# Patient Record
Sex: Male | Born: 1960 | Race: White | Hispanic: No | Marital: Married | Smoking: Never smoker
Health system: Southern US, Community
[De-identification: ages and names within clinical notes are randomized; demographics above are authoritative.]

## PROBLEM LIST (undated history)

## (undated) DIAGNOSIS — E0590 Thyrotoxicosis, unspecified without thyrotoxic crisis or storm: Secondary | ICD-10-CM

## (undated) DIAGNOSIS — C20 Malignant neoplasm of rectum: Secondary | ICD-10-CM

## (undated) DIAGNOSIS — C801 Malignant (primary) neoplasm, unspecified: Secondary | ICD-10-CM

## (undated) DIAGNOSIS — M1990 Unspecified osteoarthritis, unspecified site: Secondary | ICD-10-CM

## (undated) DIAGNOSIS — I10 Essential (primary) hypertension: Secondary | ICD-10-CM

## (undated) DIAGNOSIS — G4730 Sleep apnea, unspecified: Secondary | ICD-10-CM

## (undated) HISTORY — PX: REFRACTIVE SURGERY: SHX103

---

## 2014-10-30 ENCOUNTER — Other Ambulatory Visit: Payer: Self-pay | Admitting: General Surgery

## 2014-10-30 DIAGNOSIS — C799 Secondary malignant neoplasm of unspecified site: Principal | ICD-10-CM

## 2014-10-30 DIAGNOSIS — C20 Malignant neoplasm of rectum: Secondary | ICD-10-CM

## 2014-10-31 ENCOUNTER — Other Ambulatory Visit: Payer: Self-pay | Admitting: General Surgery

## 2014-10-31 DIAGNOSIS — C20 Malignant neoplasm of rectum: Secondary | ICD-10-CM

## 2014-10-31 DIAGNOSIS — C799 Secondary malignant neoplasm of unspecified site: Secondary | ICD-10-CM

## 2014-11-06 ENCOUNTER — Encounter (HOSPITAL_COMMUNITY): Payer: Self-pay

## 2014-11-06 ENCOUNTER — Ambulatory Visit (HOSPITAL_COMMUNITY)
Admission: RE | Admit: 2014-11-06 | Discharge: 2014-11-06 | Disposition: A | Discharge status: Home / Self Care | Payer: BLUE CROSS/BLUE SHIELD | Source: Ambulatory Visit | Attending: General Surgery | Admitting: General Surgery

## 2014-11-06 DIAGNOSIS — C20 Malignant neoplasm of rectum: Secondary | ICD-10-CM | POA: Diagnosis not present

## 2014-11-06 HISTORY — DX: Essential (primary) hypertension: I10

## 2014-11-06 HISTORY — DX: Malignant (primary) neoplasm, unspecified: C80.1

## 2014-11-06 MED ORDER — IOHEXOL 300 MG/ML  SOLN
75.0000 mL | Freq: Once | INTRAMUSCULAR | Status: AC | PRN
Start: 2014-11-06 — End: 2014-11-06
  Administered 2014-11-06: 75 mL via INTRAVENOUS

## 2014-11-11 ENCOUNTER — Other Ambulatory Visit: Payer: Self-pay | Admitting: General Surgery

## 2014-11-19 ENCOUNTER — Other Ambulatory Visit (HOSPITAL_COMMUNITY): Payer: BLUE CROSS/BLUE SHIELD

## 2014-11-19 NOTE — Patient Instructions (Addendum)
YOUR PROCEDURE IS SCHEDULED ON :  11/22/14  REPORT TO [facility] MAIN ENTRANCE FOLLOW SIGNS TO EAST ELEVATOR - GO TO 3rd FLOOR CHECK IN AT 3 EAST NURSES STATION (SHORT STAY) AT:  5:30 AM  CALL THIS NUMBER IF YOU HAVE PROBLEMS THE MORNING OF SURGERY [phone]  REMEMBER:ONLY 1 PER PERSON MAY GO TO SHORT STAY WITH YOU TO GET READY THE MORNING OF YOUR SURGERY  DO NOT EAT FOOD OR DRINK LIQUIDS AFTER MIDNIGHT  TAKE THESE MEDICINES THE MORNING OF SURGERY:  LEVOTHYROXINE / FLUVASTATIN  YOU MAY NOT HAVE ANY METAL ON YOUR BODY INCLUDING HAIR PINS AND PIERCING'S. DO NOT WEAR JEWELRY, MAKEUP, LOTIONS, POWDERS OR PERFUMES. DO NOT WEAR NAIL POLISH. DO NOT SHAVE 48 HRS PRIOR TO SURGERY. MEN MAY SHAVE FACE AND NECK.  DO NOT [place]. Roberto Mitchell IS NOT RESPONSIBLE FOR VALUABLES.  CONTACTS, DENTURES OR PARTIALS MAY NOT BE WORN TO SURGERY. LEAVE SUITCASE IN CAR. CAN BE BROUGHT TO ROOM AFTER SURGERY.  PATIENTS DISCHARGED THE DAY OF SURGERY WILL NOT BE ALLOWED TO DRIVE HOME.  PLEASE READ OVER THE FOLLOWING INSTRUCTION SHEETS _________________________________________________________________________________                                          [place] - PREPARING FOR SURGERY  Before surgery, you can play an important role.  Because skin is not sterile, your skin needs to be as free of germs as possible.  You can reduce the number of germs on your skin by washing with CHG (chlorahexidine gluconate) soap before surgery.  CHG is an antiseptic cleaner which kills germs and bonds with the skin to continue killing germs even after washing. Please DO NOT use if you have an allergy to CHG or antibacterial soaps.  If your skin becomes reddened/irritated stop using the CHG and inform your nurse when you arrive at Short Stay. Do not shave (including legs and underarms) for at least 48 hours prior to the first CHG shower.  You may shave your face. Please follow these  instructions carefully:   1.  Shower with CHG Soap the night before surgery and the  morning of Surgery.   2.  If you choose to wash your hair, wash your hair first as usual with your  normal  Shampoo.   3.  After you shampoo, rinse your hair and body thoroughly to remove the  shampoo.                                         4.  Use CHG as you would any other liquid soap.  You can apply chg directly  to the skin and wash . Gently wash with scrungie or clean wascloth    5.  Apply the CHG Soap to your body ONLY FROM THE NECK DOWN.   Do not use on open                           Wound or open sores. Avoid contact with eyes, ears mouth and genitals (private parts).                        Genitals (private parts) with your normal  soap.              6.  Wash thoroughly, paying special attention to the area where your surgery  will be performed.   7.  Thoroughly rinse your body with warm water from the neck down.   8.  DO NOT shower/wash with your normal soap after using and rinsing off  the CHG Soap .                9.  Pat yourself dry with a clean towel.             10.  Wear clean night clothes to bed after shower             11.  Place clean sheets on your bed the night of your first shower and do not  sleep with pets.  Day of Surgery : Do not apply any lotions/deodorants the morning of surgery.  Please wear clean clothes to the hospital/surgery center.  FAILURE TO FOLLOW THESE INSTRUCTIONS MAY RESULT IN THE CANCELLATION OF YOUR SURGERY    PATIENT SIGNATURE_________________________________  ______________________________________________________________________                   CLEAR LIQUID DIET   Foods Allowed                                                                     Foods Excluded  Coffee and tea, regular and decaf                             liquids that you cannot  Plain Jell-O in any flavor                                             see  through such as: Fruit ices (not with fruit pulp)                                     milk, soups, orange juice  Iced Popsicles                                                 All solid food Carbonated beverages, regular and diet                                    Cranberry, grape and apple juices Sports drinks like Gatorade Lightly seasoned clear broth or consume(fat free) Sugar, honey syrup  Sample Menu Breakfast                                Lunch  Supper Cranberry juice                    Beef broth                            Chicken broth Jell-O                                     Grape juice                           Apple juice Coffee or tea                        Jell-O                                      Popsicle                                                Coffee or tea                        Coffee or tea  _____________________________________________________________________

## 2014-11-20 ENCOUNTER — Encounter (HOSPITAL_COMMUNITY): Payer: Self-pay

## 2014-11-20 ENCOUNTER — Encounter (HOSPITAL_COMMUNITY)
Admission: RE | Admit: 2014-11-20 | Discharge: 2014-11-20 | Disposition: A | Discharge status: Home / Self Care | Payer: BLUE CROSS/BLUE SHIELD | Source: Ambulatory Visit | Attending: General Surgery | Admitting: General Surgery

## 2014-11-20 DIAGNOSIS — Z9841 Cataract extraction status, right eye: Secondary | ICD-10-CM | POA: Diagnosis not present

## 2014-11-20 DIAGNOSIS — M1990 Unspecified osteoarthritis, unspecified site: Secondary | ICD-10-CM | POA: Diagnosis not present

## 2014-11-20 DIAGNOSIS — Z79899 Other long term (current) drug therapy: Secondary | ICD-10-CM | POA: Diagnosis not present

## 2014-11-20 DIAGNOSIS — Z8371 Family history of colonic polyps: Secondary | ICD-10-CM | POA: Diagnosis not present

## 2014-11-20 DIAGNOSIS — Z7982 Long term (current) use of aspirin: Secondary | ICD-10-CM | POA: Diagnosis not present

## 2014-11-20 DIAGNOSIS — Z9842 Cataract extraction status, left eye: Secondary | ICD-10-CM | POA: Diagnosis not present

## 2014-11-20 DIAGNOSIS — E039 Hypothyroidism, unspecified: Secondary | ICD-10-CM | POA: Diagnosis not present

## 2014-11-20 DIAGNOSIS — Z791 Long term (current) use of non-steroidal anti-inflammatories (NSAID): Secondary | ICD-10-CM | POA: Diagnosis not present

## 2014-11-20 DIAGNOSIS — I10 Essential (primary) hypertension: Secondary | ICD-10-CM | POA: Diagnosis not present

## 2014-11-20 DIAGNOSIS — C20 Malignant neoplasm of rectum: Secondary | ICD-10-CM | POA: Diagnosis not present

## 2014-11-20 DIAGNOSIS — E7800 Pure hypercholesterolemia, unspecified: Secondary | ICD-10-CM | POA: Diagnosis not present

## 2014-11-20 DIAGNOSIS — K649 Unspecified hemorrhoids: Secondary | ICD-10-CM | POA: Diagnosis not present

## 2014-11-20 HISTORY — DX: Unspecified osteoarthritis, unspecified site: M19.90

## 2014-11-20 HISTORY — DX: Malignant neoplasm of rectum: C20

## 2014-11-20 HISTORY — DX: Thyrotoxicosis, unspecified without thyrotoxic crisis or storm: E05.90

## 2014-11-20 LAB — BASIC METABOLIC PANEL
Anion gap: 7 (ref 5–15)
BUN: 17 mg/dL (ref 6–20)
CHLORIDE: 104 mmol/L (ref 101–111)
CO2: 27 mmol/L (ref 22–32)
CREATININE: 1.32 mg/dL — AB (ref 0.61–1.24)
Calcium: 9.2 mg/dL (ref 8.9–10.3)
GFR calc non Af Amer: >60 mL/min (ref >60)
Glucose, Bld: 87 mg/dL (ref 65–99)
POTASSIUM: 4.2 mmol/L (ref 3.5–5.1)
SODIUM: 138 mmol/L (ref 135–145)

## 2014-11-20 LAB — CBC
HEMATOCRIT: 43.5 % (ref 39.0–52.0)
HEMOGLOBIN: 15 g/dL (ref 13.0–17.0)
MCH: 32.5 pg (ref 26.0–34.0)
MCHC: 34.5 g/dL (ref 30.0–36.0)
MCV: 94.2 fL (ref 78.0–100.0)
Platelets: 186 10*3/uL (ref 150–400)
RBC: 4.62 MIL/uL (ref 4.22–5.81)
RDW: 13 % (ref 11.5–15.5)
WBC: 8.9 10*3/uL (ref 4.0–10.5)

## 2014-11-20 NOTE — Progress Notes (Signed)
   11/20/14 1331  OBSTRUCTIVE SLEEP APNEA  Have you ever been diagnosed with sleep apnea through a sleep study? No  Do you snore loudly (loud enough to be heard through closed doors)?  1  Do you often feel tired, fatigued, or sleepy during the daytime (such as falling asleep during driving or talking to someone)? 0  Has anyone observed you stop breathing during your sleep? 0  Do you have, or are you being treated for high blood pressure? 1  BMI more than 35 kg/m2? 1  Age > 50 (1-yes) 1  Neck circumference greater than:Male 16 inches or larger, Male 17inches or larger? 1  Male Gender (Yes=1) 1  Obstructive Sleep Apnea Score 6

## 2014-11-21 MED ORDER — SODIUM CHLORIDE 0.9 % IV SOLN
1.0000 g | INTRAVENOUS | Status: AC
  Administered 2014-11-22: 1 g via INTRAVENOUS
  Filled 2014-11-21 (×2): qty 1

## 2014-11-22 ENCOUNTER — Observation Stay (HOSPITAL_COMMUNITY)
Admission: RE | Admit: 2014-11-22 | Discharge: 2014-11-23 | Disposition: A | Discharge status: Home / Self Care | Payer: BLUE CROSS/BLUE SHIELD | Source: Ambulatory Visit | Attending: General Surgery | Admitting: General Surgery

## 2014-11-22 ENCOUNTER — Encounter (HOSPITAL_COMMUNITY): Payer: Self-pay | Admitting: *Deleted

## 2014-11-22 ENCOUNTER — Ambulatory Visit (HOSPITAL_COMMUNITY): Payer: BLUE CROSS/BLUE SHIELD

## 2014-11-22 ENCOUNTER — Encounter (HOSPITAL_COMMUNITY)
Admission: RE | Disposition: A | Discharge status: Home / Self Care | Payer: Self-pay | Source: Ambulatory Visit | Attending: General Surgery

## 2014-11-22 ENCOUNTER — Ambulatory Visit (HOSPITAL_COMMUNITY): Payer: BLUE CROSS/BLUE SHIELD | Admitting: Certified Registered"

## 2014-11-22 DIAGNOSIS — I10 Essential (primary) hypertension: Secondary | ICD-10-CM | POA: Insufficient documentation

## 2014-11-22 DIAGNOSIS — Z79899 Other long term (current) drug therapy: Secondary | ICD-10-CM | POA: Insufficient documentation

## 2014-11-22 DIAGNOSIS — E039 Hypothyroidism, unspecified: Secondary | ICD-10-CM | POA: Insufficient documentation

## 2014-11-22 DIAGNOSIS — Z9842 Cataract extraction status, left eye: Secondary | ICD-10-CM | POA: Insufficient documentation

## 2014-11-22 DIAGNOSIS — C20 Malignant neoplasm of rectum: Principal | ICD-10-CM | POA: Diagnosis present

## 2014-11-22 DIAGNOSIS — Z791 Long term (current) use of non-steroidal anti-inflammatories (NSAID): Secondary | ICD-10-CM | POA: Insufficient documentation

## 2014-11-22 DIAGNOSIS — E7800 Pure hypercholesterolemia, unspecified: Secondary | ICD-10-CM | POA: Insufficient documentation

## 2014-11-22 DIAGNOSIS — Z9841 Cataract extraction status, right eye: Secondary | ICD-10-CM | POA: Insufficient documentation

## 2014-11-22 DIAGNOSIS — Z7982 Long term (current) use of aspirin: Secondary | ICD-10-CM | POA: Insufficient documentation

## 2014-11-22 DIAGNOSIS — Z8371 Family history of colonic polyps: Secondary | ICD-10-CM | POA: Insufficient documentation

## 2014-11-22 DIAGNOSIS — K649 Unspecified hemorrhoids: Secondary | ICD-10-CM | POA: Insufficient documentation

## 2014-11-22 DIAGNOSIS — M1990 Unspecified osteoarthritis, unspecified site: Secondary | ICD-10-CM | POA: Insufficient documentation

## 2014-11-22 HISTORY — PX: TRANSANAL EXCISION OF RECTAL MASS: SHX6134

## 2014-11-22 SURGERY — EXCISION, MASS, RECTUM, ANAL APPROACH
Anesthesia: General | Site: Rectum

## 2014-11-22 MED ORDER — HYDROCODONE-ACETAMINOPHEN 5-325 MG PO TABS
1.0000 | ORAL_TABLET | ORAL | Status: DC | PRN
  Administered 2014-11-22: 2 via ORAL
  Administered 2014-11-22: 1 via ORAL
  Administered 2014-11-23: 2 via ORAL
  Filled 2014-11-22: qty 2
  Filled 2014-11-22: qty 1
  Filled 2014-11-22: qty 2

## 2014-11-22 MED ORDER — BUPIVACAINE-EPINEPHRINE 0.25% -1:200000 IJ SOLN
INTRAMUSCULAR | Status: DC | PRN
  Administered 2014-11-22: 21 mL

## 2014-11-22 MED ORDER — HYDROMORPHONE HCL 1 MG/ML IJ SOLN
INTRAMUSCULAR | Status: AC
  Filled 2014-11-22: qty 1

## 2014-11-22 MED ORDER — ONDANSETRON HCL 4 MG/2ML IJ SOLN
INTRAMUSCULAR | Status: DC | PRN
  Administered 2014-11-22: 4 mg via INTRAVENOUS

## 2014-11-22 MED ORDER — PROPOFOL 10 MG/ML IV BOLUS
INTRAVENOUS | Status: DC | PRN
  Administered 2014-11-22: 200 mg via INTRAVENOUS

## 2014-11-22 MED ORDER — FENTANYL CITRATE (PF) 100 MCG/2ML IJ SOLN
INTRAMUSCULAR | Status: DC | PRN
  Administered 2014-11-22: 50 ug via INTRAVENOUS
  Administered 2014-11-22: 100 ug via INTRAVENOUS

## 2014-11-22 MED ORDER — SENNOSIDES-DOCUSATE SODIUM 8.6-50 MG PO TABS
1.0000 | ORAL_TABLET | Freq: Every day | ORAL | Status: DC
  Administered 2014-11-22: 1 via ORAL
  Filled 2014-11-22: qty 1

## 2014-11-22 MED ORDER — ROCURONIUM BROMIDE 100 MG/10ML IV SOLN
INTRAVENOUS | Status: DC | PRN
  Administered 2014-11-22: 10 mg via INTRAVENOUS
  Administered 2014-11-22: 35 mg via INTRAVENOUS
  Administered 2014-11-22: 20 mg via INTRAVENOUS
  Administered 2014-11-22: 10 mg via INTRAVENOUS
  Administered 2014-11-22: 5 mg via INTRAVENOUS
  Administered 2014-11-22: 10 mg via INTRAVENOUS

## 2014-11-22 MED ORDER — BISACODYL 10 MG RE SUPP: 10.0000 mg | Freq: Every day | RECTAL | Status: DC | PRN

## 2014-11-22 MED ORDER — PROPOFOL 10 MG/ML IV BOLUS
INTRAVENOUS | Status: AC
  Filled 2014-11-22: qty 20

## 2014-11-22 MED ORDER — DEXAMETHASONE SODIUM PHOSPHATE 10 MG/ML IJ SOLN
INTRAMUSCULAR | Status: DC | PRN
  Administered 2014-11-22: 10 mg via INTRAVENOUS

## 2014-11-22 MED ORDER — NEOSTIGMINE METHYLSULFATE 10 MG/10ML IV SOLN
INTRAVENOUS | Status: DC | PRN
  Administered 2014-11-22: 5 mg via INTRAVENOUS

## 2014-11-22 MED ORDER — HYDROMORPHONE HCL 1 MG/ML IJ SOLN
INTRAMUSCULAR | Status: DC | PRN
  Administered 2014-11-22: 1 mg via INTRAVENOUS
  Administered 2014-11-22 (×2): 0.5 mg via INTRAVENOUS

## 2014-11-22 MED ORDER — MEPERIDINE HCL 50 MG/ML IJ SOLN: 6.2500 mg | INTRAMUSCULAR | Status: DC | PRN

## 2014-11-22 MED ORDER — BUPIVACAINE-EPINEPHRINE (PF) 0.25% -1:200000 IJ SOLN
INTRAMUSCULAR | Status: AC
  Filled 2014-11-22: qty 30

## 2014-11-22 MED ORDER — LEVOTHYROXINE SODIUM 25 MCG PO TABS
125.0000 ug | ORAL_TABLET | Freq: Every day | ORAL | Status: DC
  Administered 2014-11-23: 125 ug via ORAL
  Filled 2014-11-22 (×2): qty 1

## 2014-11-22 MED ORDER — GLYCOPYRROLATE 0.2 MG/ML IJ SOLN
INTRAMUSCULAR | Status: AC
  Filled 2014-11-22: qty 4

## 2014-11-22 MED ORDER — DEXAMETHASONE SODIUM PHOSPHATE 10 MG/ML IJ SOLN
INTRAMUSCULAR | Status: AC
  Filled 2014-11-22: qty 1

## 2014-11-22 MED ORDER — GLYCOPYRROLATE 0.2 MG/ML IJ SOLN
INTRAMUSCULAR | Status: DC | PRN
  Administered 2014-11-22: .8 mg via INTRAVENOUS

## 2014-11-22 MED ORDER — EPHEDRINE SULFATE 50 MG/ML IJ SOLN
INTRAMUSCULAR | Status: AC
  Filled 2014-11-22: qty 1

## 2014-11-22 MED ORDER — SIMETHICONE 80 MG PO CHEW: 40.0000 mg | CHEWABLE_TABLET | Freq: Four times a day (QID) | ORAL | Status: DC | PRN

## 2014-11-22 MED ORDER — MIDAZOLAM HCL 5 MG/5ML IJ SOLN
INTRAMUSCULAR | Status: DC | PRN
  Administered 2014-11-22: 2 mg via INTRAVENOUS

## 2014-11-22 MED ORDER — PROMETHAZINE HCL 25 MG/ML IJ SOLN
6.2500 mg | INTRAMUSCULAR | Status: DC | PRN
Start: 2014-11-22 — End: 2014-11-22

## 2014-11-22 MED ORDER — LIDOCAINE HCL (CARDIAC) 20 MG/ML IV SOLN
INTRAVENOUS | Status: AC
  Filled 2014-11-22: qty 5

## 2014-11-22 MED ORDER — ONDANSETRON 4 MG PO TBDP: 4.0000 mg | ORAL_TABLET | Freq: Four times a day (QID) | ORAL | Status: DC | PRN

## 2014-11-22 MED ORDER — EPHEDRINE SULFATE 50 MG/ML IJ SOLN
INTRAMUSCULAR | Status: DC | PRN
  Administered 2014-11-22 (×2): 5 mg via INTRAVENOUS

## 2014-11-22 MED ORDER — ONDANSETRON HCL 4 MG/2ML IJ SOLN: 4.0000 mg | Freq: Four times a day (QID) | INTRAMUSCULAR | Status: DC | PRN

## 2014-11-22 MED ORDER — CELECOXIB 200 MG PO CAPS
200.0000 mg | ORAL_CAPSULE | Freq: Every day | ORAL | Status: DC
  Administered 2014-11-22: 200 mg via ORAL
  Filled 2014-11-22 (×2): qty 1

## 2014-11-22 MED ORDER — 0.9 % SODIUM CHLORIDE (POUR BTL) OPTIME
TOPICAL | Status: DC | PRN
  Administered 2014-11-22: 1000 mL

## 2014-11-22 MED ORDER — LACTATED RINGERS IV SOLN: INTRAVENOUS | Status: DC

## 2014-11-22 MED ORDER — NEOSTIGMINE METHYLSULFATE 10 MG/10ML IV SOLN
INTRAVENOUS | Status: AC
  Filled 2014-11-22: qty 1

## 2014-11-22 MED ORDER — LACTATED RINGERS IV SOLN
INTRAVENOUS | Status: DC | PRN
  Administered 2014-11-22 (×2): via INTRAVENOUS

## 2014-11-22 MED ORDER — LACTATED RINGERS IR SOLN
Status: DC | PRN
  Administered 2014-11-22: 1000 mL

## 2014-11-22 MED ORDER — LIDOCAINE HCL (CARDIAC) 20 MG/ML IV SOLN
INTRAVENOUS | Status: DC | PRN
  Administered 2014-11-22: 50 mg via INTRAVENOUS

## 2014-11-22 MED ORDER — ONDANSETRON HCL 4 MG/2ML IJ SOLN
INTRAMUSCULAR | Status: AC
  Filled 2014-11-22: qty 2

## 2014-11-22 MED ORDER — KCL IN DEXTROSE-NACL 20-5-0.45 MEQ/L-%-% IV SOLN
INTRAVENOUS | Status: DC
  Filled 2014-11-22: qty 1000

## 2014-11-22 MED ORDER — HYDROMORPHONE HCL 2 MG/ML IJ SOLN
INTRAMUSCULAR | Status: AC
  Filled 2014-11-22: qty 1

## 2014-11-22 MED ORDER — HYDROCHLOROTHIAZIDE 12.5 MG PO CAPS
12.5000 mg | ORAL_CAPSULE | Freq: Every day | ORAL | Status: DC
  Administered 2014-11-22 – 2014-11-23 (×2): 12.5 mg via ORAL
  Filled 2014-11-22 (×2): qty 1

## 2014-11-22 MED ORDER — MORPHINE SULFATE (PF) 2 MG/ML IV SOLN
2.0000 mg | INTRAVENOUS | Status: DC | PRN
  Administered 2014-11-22: 2 mg via INTRAVENOUS
  Filled 2014-11-22: qty 1

## 2014-11-22 MED ORDER — ROCURONIUM BROMIDE 100 MG/10ML IV SOLN
INTRAVENOUS | Status: AC
  Filled 2014-11-22: qty 1

## 2014-11-22 MED ORDER — SUCCINYLCHOLINE CHLORIDE 20 MG/ML IJ SOLN
INTRAMUSCULAR | Status: DC | PRN
  Administered 2014-11-22: 100 mg via INTRAVENOUS

## 2014-11-22 MED ORDER — FENTANYL CITRATE (PF) 250 MCG/5ML IJ SOLN
INTRAMUSCULAR | Status: AC
  Filled 2014-11-22: qty 25

## 2014-11-22 MED ORDER — MIDAZOLAM HCL 2 MG/2ML IJ SOLN
INTRAMUSCULAR | Status: AC
  Filled 2014-11-22: qty 4

## 2014-11-22 MED ORDER — HYDROMORPHONE HCL 1 MG/ML IJ SOLN
0.2500 mg | INTRAMUSCULAR | Status: DC | PRN
  Administered 2014-11-22 (×4): 0.5 mg via INTRAVENOUS

## 2014-11-22 SURGICAL SUPPLY — 59 items
BLADE EXTENDED COATED 6.5IN (ELECTRODE) IMPLANT
BRIEF STRETCH FOR OB PAD LRG (UNDERPADS AND DIAPERS) IMPLANT
CABLE HIGH FREQUENCY MONO STRZ (ELECTRODE) ×2 IMPLANT
COVER SURGICAL LIGHT HANDLE (MISCELLANEOUS) IMPLANT
DEVICE SUT QUICK LOAD TK 5 (STAPLE) IMPLANT
DEVICE SUT TI-KNOT TK 5X26 (MISCELLANEOUS) IMPLANT
DRAPE CAMERA CLOSED 9X96 (DRAPES) ×2 IMPLANT
DRAPE LAPAROSCOPIC ABDOMINAL (DRAPES) IMPLANT
DRAPE LAPAROTOMY T 102X78X121 (DRAPES) IMPLANT
DRAPE WARM FLUID 44X44 (DRAPE) ×2 IMPLANT
ELECT L-HOOK LAP 45CM DISP (ELECTROSURGICAL) ×2
ELECT PENCIL ROCKER SW 15FT (MISCELLANEOUS) ×2 IMPLANT
ELECT REM PT RETURN 9FT ADLT (ELECTROSURGICAL) ×2
ELECTRODE L-HOOK LAP 45CM DISP (ELECTROSURGICAL) ×1 IMPLANT
ELECTRODE REM PT RTRN 9FT ADLT (ELECTROSURGICAL) ×1 IMPLANT
GAUZE SPONGE 4X4 12PLY STRL (GAUZE/BANDAGES/DRESSINGS) IMPLANT
GAUZE SPONGE 4X4 16PLY XRAY LF (GAUZE/BANDAGES/DRESSINGS) IMPLANT
GLOVE BIO SURGEON STRL SZ 6.5 (GLOVE) ×2 IMPLANT
GLOVE BIOGEL PI IND STRL 7.0 (GLOVE) ×1 IMPLANT
GLOVE BIOGEL PI INDICATOR 7.0 (GLOVE) ×1
GLOVE ECLIPSE 8.0 STRL XLNG CF (GLOVE) ×2 IMPLANT
GLOVE INDICATOR 8.0 STRL GRN (GLOVE) IMPLANT
GOWN STRL REUS W/TWL 2XL LVL3 (GOWN DISPOSABLE) ×4 IMPLANT
KIT BASIN OR (CUSTOM PROCEDURE TRAY) ×2 IMPLANT
LEGGING LITHOTOMY PAIR STRL (DRAPES) ×2 IMPLANT
LUBRICANT JELLY K Y 4OZ (MISCELLANEOUS) ×2 IMPLANT
NEEDLE HYPO 22GX1.5 SAFETY (NEEDLE) IMPLANT
NS IRRIG 1000ML POUR BTL (IV SOLUTION) ×2 IMPLANT
PACK GENERAL/GYN (CUSTOM PROCEDURE TRAY) ×2 IMPLANT
PLATFORM TRANSANAL ACCESS 4X5 (MISCELLANEOUS) ×1 IMPLANT
PLATFORM TRANSANAL ACCESS 4X5. (MISCELLANEOUS) ×2
RETRACTOR STAY HOOK 5MM (MISCELLANEOUS) IMPLANT
RETRACTOR WILSON SYSTEM (INSTRUMENTS) IMPLANT
SCISSORS LAP 5X35 DISP (ENDOMECHANICALS) IMPLANT
SET IRRIG TUBING LAPAROSCOPIC (IRRIGATION / IRRIGATOR) ×2 IMPLANT
SHEARS HARMONIC ACE PLUS 36CM (ENDOMECHANICALS) ×2 IMPLANT
SOLUTION ANTI FOG 6CC (MISCELLANEOUS) ×2 IMPLANT
STOPCOCK 4 WAY LG BORE MALE ST (IV SETS) IMPLANT
SUT PDS AB 2-0 CT2 27 (SUTURE) IMPLANT
SUT PDS AB 3-0 SH 27 (SUTURE) IMPLANT
SUT SILK 2 0 (SUTURE)
SUT SILK 2 0 SH CR/8 (SUTURE) IMPLANT
SUT SILK 2-0 18XBRD TIE 12 (SUTURE) IMPLANT
SUT SILK 3 0 SH 30 (SUTURE) IMPLANT
SUT SILK 3 0 SH CR/8 (SUTURE) IMPLANT
SUT V-LOC BARB 180 2/0GR6 GS22 (SUTURE) ×4
SUT VIC AB 2-0 SH 27 (SUTURE) ×2
SUT VIC AB 2-0 SH 27X BRD (SUTURE) ×2 IMPLANT
SUT VIC AB 2-0 UR6 27 (SUTURE) IMPLANT
SUT VIC AB 3-0 SH 18 (SUTURE) IMPLANT
SUT VIC AB 3-0 SH 27 (SUTURE)
SUT VIC AB 3-0 SH 27XBRD (SUTURE) IMPLANT
SUT VIC AB 4-0 SH 18 (SUTURE) IMPLANT
SUTURE V-LC BRB 180 2/0GR6GS22 (SUTURE) ×2 IMPLANT
TOWEL OR 17X26 10 PK STRL BLUE (TOWEL DISPOSABLE) ×2 IMPLANT
TRAY FOLEY W/METER SILVER 14FR (SET/KITS/TRAYS/PACK) ×2 IMPLANT
TRAY FOLEY W/METER SILVER 16FR (SET/KITS/TRAYS/PACK) ×2 IMPLANT
TUBING INSUFFLATION 10FT LAP (TUBING) ×2 IMPLANT
YANKAUER SUCT BULB TIP 10FT TU (MISCELLANEOUS) ×4 IMPLANT

## 2014-11-22 NOTE — Anesthesia Procedure Notes (Signed)
Procedure Name: Intubation Date/Time: 11/22/2014 7:34 AM Performed by: Noralyn Pick D Pre-anesthesia Checklist: Patient identified, Emergency Drugs available, Suction available and Patient being monitored Patient Re-evaluated:Patient Re-evaluated prior to inductionOxygen Delivery Method: Circle System Utilized Preoxygenation: Pre-oxygenation with 100% oxygen Intubation Type: IV induction Ventilation: Mask ventilation without difficulty Laryngoscope Size: Mac and 4 Grade View: Grade II Tube type: Oral Tube size: 7.5 mm Number of attempts: 1 Airway Equipment and Method: Stylet and Oral airway Placement Confirmation: ETT inserted through vocal cords under direct vision,  positive ETCO2 and breath sounds checked- equal and bilateral Secured at: 22 cm Tube secured with: Tape Dental Injury: Teeth and Oropharynx as per pre-operative assessment

## 2014-11-22 NOTE — Op Note (Signed)
11/22/2014  10:45 AM  PATIENT:  Roberto Mitchell  54 y.o. male  Patient Care Team: Nicola Girt, DO as PCP - General (Internal Medicine)  PRE-OPERATIVE DIAGNOSIS:  rectal cancer   POST-OPERATIVE DIAGNOSIS:  rectal cancer   PROCEDURE:   TRANSANAL MINIMALLY INVASIVE  EXCISION OF RECTAL CANCER   Surgeon(s): Leighton Ruff, MD  ASSISTANT: none   ANESTHESIA:   local and general  EBL:  Total I/O In: 1000 [I.V.:1000] Out: -   Delay start of Pharmacological VTE agent (>24hrs) due to surgical blood loss or risk of bleeding:  no  DRAINS: none   SPECIMEN:  Source of Specimen:  anterior rectal wall   DISPOSITION OF SPECIMEN:  PATHOLOGY  COUNTS:  NO needle count incorrect by 1  and ACTION TAKEN: X-RAY(S) TAKEN no needle noted on films of the pelvis  PLAN OF CARE: Admit for overnight observation  PATIENT DISPOSITION:  PACU - hemodynamically stable.  INDICATION: 54 y.o. M who presents to my office with a rectal cancer found and a polyp resected piecemeal. Repeat biopsies of the base of the wound showed no signs of dysplastic tissue. Given the piecemeal resection, we are here today to get a full thickness specimen and confirmed stage I disease.  The anatomy & physiology of the digestive tract was discussed.  The pathophysiology of the rectal pathology was discussed.  Natural history risks without surgery was discussed.   I feel the risks of no intervention will lead to serious problems that outweigh the operative risks; therefore, I recommended surgery.    Laparoscopic & open abdominal techniques were discussed.  I recommended we start with a partial proctectomy by transanal endoscopic microsurgery (TEM) for excisional biopsy to remove the pathology and hopefully cure and/or control the pathology.  This technique can offer less operative risk and faster post-operative recovery.  Possible need for immediate or later abdominal surgery for further treatment was discussed.   Risks such as  bleeding, abscess, reoperation, ostomy, heart attack, death, and other risks were discussed.   I noted a good likelihood this will help address the problem.  Goals of post-operative recovery were discussed as well.  We will work to minimize complications.  An educational handout was given as well.  Questions were answered.  The patient expresses understanding & wishes to proceed with surgery.  OR FINDINGS: Tattoo noted on the first valve of [place] anteriorly, slightly to the left of midline.   The closure rests 8 cm from the anal verge in the anterior location  DESCRIPTION: Informed consent was confirmed.  Patient received general anesthesia without difficulty.  Sequential compression devices active during the entire case.  I placed the rigid sigmoidoscope inside the anal canal and evaluated for tattoo. This was noted in the left anterior position approximately 8 to 10 centimeters inside the anal canal. The patient was placed in the prone position, taking extra care to secure and protect the patient appropriately.  The perineum and perianal regions were prepped and draped in a sterile fashion.  Surgical timeout confirmed our plan.  I did a gentle digital rectal examination with gradual anal dilation to allow placement of the gel point insertion device.  I placed the ports and the gel point and close this over the device.  We induced carbon dioxide insufflation intraluminally.  I oriented the scope and the patient such that the specimen rested towards the floor at the 6:00 position.  I could easily identify the tattooed.  I could not identify any stellate scar.  I went ahead and used tip point cautery to mark 5 mm margins circumferentially.  I then did a full thickness transection at the distal margin.  I came around laterally.  I switched over to harmonic dissection.  I lifted the specimen off the pelvic canal for a good deep margin. There was a small peritoneal defect noted. I gradually came more  proximally and transected at the proximal margin.  I ensured hemostasis.  I inspected the main specimen.  I could palpate an area that appeared to be scar in the left distal region of the specimen. I decided to take an additional margin of tissue near this area. I sutured this to the specimen to help with orientation.    Hemostasis excellent.   I reapproximated the wound with a 2-0 V LOC stitches.  I was unable to close this all of the way due to difficulty orienting the instruments and the camera within the rectal canal. I did make sure that the peritoneal defect was closed with a V lock sutures. Once the edges were closed, I removed the gel point. I placed a PPH long anoscope into the anal canal and was able to close the remaining portion using 2-0 Vicryl sutures on a UR 6 needle. This brought things together well.  Hemostasis was excellent.  Carbo dioxide evacuated.  I confirmed patency using the rigid sigmoidoscope. The rectal canal was opened and the mucosal edges appeared to be reapproximated. There was a discrepancy in the needle count.  An X ray confirmed it was not in the rectum.  The patient is being extubated go to recovery room.

## 2014-11-22 NOTE — Anesthesia Preprocedure Evaluation (Addendum)
Anesthesia Evaluation  Patient identified by MRN, date of birth, ID band Patient awake    Reviewed: Allergy & Precautions, NPO status , Patient's Chart, lab work & pertinent test results  Airway Mallampati: I  TM Distance: >3 FB Neck ROM: Full    Dental  (+) Teeth Intact   Pulmonary neg pulmonary ROS,    breath sounds clear to auscultation       Cardiovascular hypertension, Pt. on medications  Rhythm:Regular Rate:Normal     Neuro/Psych negative neurological ROS  negative psych ROS   GI/Hepatic Neg liver ROS,   Endo/Other  Hypothyroidism   Renal/GU negative Renal ROS  negative genitourinary   Musculoskeletal  (+) Arthritis ,   Abdominal   Peds negative pediatric ROS (+)  Hematology negative hematology ROS (+)   Anesthesia Other Findings   Reproductive/Obstetrics negative OB ROS                            Lab Results  Component Value Date   WBC 8.9 11/20/2014   HGB 15.0 11/20/2014   HCT 43.5 11/20/2014   MCV 94.2 11/20/2014   PLT 186 11/20/2014   Lab Results  Component Value Date   CREATININE 1.32* 11/20/2014   BUN 17 11/20/2014   NA 138 11/20/2014   K 4.2 11/20/2014   CL 104 11/20/2014   CO2 27 11/20/2014   No results found for: INR, PROTIME  EKG: normal sinus rhythm.   Anesthesia Physical Anesthesia Plan  ASA: III  Anesthesia Plan: General   Post-op Pain Management:    Induction: Intravenous  Airway Management Planned: Oral ETT  Additional Equipment:   Intra-op Plan:   Post-operative Plan: Extubation in OR  Informed Consent: I have reviewed the patients History and Physical, chart, labs and discussed the procedure including the risks, benefits and alternatives for the proposed anesthesia with the patient or authorized representative who has indicated his/her understanding and acceptance.   Dental advisory given  Plan Discussed with: CRNA  Anesthesia  Plan Comments:        Anesthesia Quick Evaluation

## 2014-11-22 NOTE — Transfer of Care (Signed)
Immediate Anesthesia Transfer of Care Note  Patient: Roberto Mitchell  Procedure(s) Performed: Procedure(s): TRANSANAL MINIMALLY INVASIVE  EXCISION OF RECTAL CANCER (N/A)  Patient Location: PACU  Anesthesia Type:General  Level of Consciousness: awake, alert  and oriented  Airway & Oxygen Therapy: Patient Spontanous Breathing and Patient connected to face mask oxygen  Post-op Assessment: Report given to RN and Post -op Vital signs reviewed and stable  Post vital signs: Reviewed and stable  Last Vitals:  Filed Vitals:   11/22/14 0514  BP: 156/98  Pulse: 62  Temp: 36.4 C  Resp: 18    Complications: No apparent anesthesia complications

## 2014-11-22 NOTE — H&P (Signed)
History of Present Illness Roberto Ruff MD; 16/10/9602 6:22 PM) The patient is a 54 year old male who presents with colorectal cancer. 54 yo M who was found to have a rectal polyp on colonoscopy. There was noted to be cancer within the polyp per pt. A rectal US showed a posterior T1 lesion at the first valve (~5-7cm) with no nodes seen. Ct abdomen was normal and showed no signs of metastatic disease. He did not have a CT scan of his chest or a CEA level drawn. He is here for a second opinion. He saw [facility] Gillie Manners at [facility]. He has no symptoms. He has a FH of pancreatic cancer but not colon cancer.    Other Problems Elbert Ewings, CMA; 10/29/2014 9:57 AM) Arthritis Hemorrhoids High blood pressure Hypercholesterolemia Rectal Cancer Thyroid Disease  Past Surgical History Elbert Ewings, CMA; 10/29/2014 9:57 AM) Cataract Surgery Bilateral. Colon Polyp Removal - Colonoscopy Colon Polyp Removal - Open  Diagnostic Studies History Elbert Ewings, CMA; 10/29/2014 9:57 AM) Colonoscopy within last year  Allergies Elbert Ewings, CMA; 10/29/2014 9:58 AM) No Known Drug Allergies10/11/2014  Medication History Elbert Ewings, CMA; 10/29/2014 9:59 AM) Celecoxib (200MG  Capsule, Oral) Active. Hydrochlorothiazide (12.5MG  Tablet, Oral) Active. Fluvastatin Sodium ER (80MG  Tablet ER 24HR, Oral) Active. Levothyroxine Sodium (125MCG Tablet, Oral) Active. Aspirin (81MG  Tablet, Oral) Active. Multiple Vitamins (Oral) Active. Medications Reconciled  Social History Elbert Ewings, [place]; 10/29/2014 9:57 AM) Alcohol use Occasional alcohol use. Caffeine use Carbonated beverages, Coffee, Tea. No drug use Tobacco use Never smoker.  Family History Elbert Ewings, [place]; 10/29/2014 9:57 AM) Arthritis Brother, Mother. Colon Polyps Brother. Diabetes Mellitus Father. Heart Disease Brother, Father, Mother. Heart disease in male family member before age 41 Hypertension Brother,  Mother. Malignant Neoplasm Of Pancreas Mother.    Review of Systems Elbert Ewings CMA; 10/29/2014 9:57 AM) General Present- Weight Loss. Not Present- Appetite Loss, Chills, Fatigue, Fever, Night Sweats and Weight Gain. Skin Not Present- Change in Wart/Mole, Dryness, Hives, Jaundice, New Lesions, Non-Healing Wounds, Rash and Ulcer. HEENT Not Present- Earache, Hearing Loss, Hoarseness, Nose Bleed, Oral Ulcers, Ringing in the Ears, Seasonal Allergies, Sinus Pain, Sore Throat, Visual Disturbances, Wears glasses/contact lenses and Yellow Eyes. Respiratory Present- Snoring. Not Present- Bloody sputum, Chronic Cough, Difficulty Breathing and Wheezing. Breast Not Present- Breast Mass, Breast Pain, Nipple Discharge and Skin Changes. Cardiovascular Not Present- Chest Pain, Difficulty Breathing Lying Down, Leg Cramps, Palpitations, Rapid Heart Rate, Shortness of Breath and Swelling of Extremities. Gastrointestinal Present- Hemorrhoids. Not Present- Abdominal Pain, Bloating, Bloody Stool, Change in Bowel Habits, Chronic diarrhea, Constipation, Difficulty Swallowing, Excessive gas, Gets full quickly at meals, Indigestion, Nausea, Rectal Pain and Vomiting. Male Genitourinary Not Present- Blood in Urine, Change in Urinary Stream, Frequency, Impotence, Nocturia, Painful Urination, Urgency and Urine Leakage. Musculoskeletal Present- Joint Pain. Not Present- Back Pain, Joint Stiffness, Muscle Pain, Muscle Weakness and Swelling of Extremities. Neurological Not Present- Decreased Memory, Fainting, Headaches, Numbness, Seizures, Tingling, Tremor, Trouble walking and Weakness. Psychiatric Not Present- Anxiety, Bipolar, Change in Sleep Pattern, Depression, Fearful and Frequent crying. Endocrine Not Present- Cold Intolerance, Excessive Hunger, Hair Changes, Heat Intolerance, Hot flashes and New Diabetes. Hematology Not Present- Easy Bruising, Excessive bleeding, Gland problems, HIV and Persistent Infections.  Vitals  Elbert Ewings CMA; 10/29/2014 10:00 AM) 10/29/2014 9:59 AM Weight: 283.6 lb Height: 72in Body Surface Area: 2.56 m Body Mass Index: 38.46 kg/m  Temp.: 97.44F(Temporal)  Pulse: 78 (Regular)  BP: 132/78 (Sitting, Left Arm, Standard)     Physical Exam Roberto Ruff MD; 54/09/8117  6:21 PM) General Mental Status-Alert. General Appearance-Not in acute distress. Build & Nutrition-Well nourished. Posture-Normal posture. Gait-Normal.  Head and Neck Head-normocephalic, atraumatic with no lesions or palpable masses. Trachea-midline.  Chest and Lung Exam Chest and lung exam reveals -on auscultation, normal breath sounds, no adventitious sounds and normal vocal resonance.  Cardiovascular Cardiovascular examination reveals -normal heart sounds, regular rate and rhythm with no murmurs and femoral artery auscultation bilaterally reveals normal pulses, no bruits, no thrills.  Abdomen Inspection Inspection of the abdomen reveals - No Hernias. Palpation/Percussion Palpation and Percussion of the abdomen reveal - Soft, Non Tender, No Rigidity (guarding), No hepatosplenomegaly and No Palpable abdominal masses.  Neurologic Neurologic evaluation reveals -alert and oriented x 3 with no impairment of recent or remote memory, normal attention span and ability to concentrate, normal sensation and normal coordination.  Musculoskeletal Normal Exam - Bilateral-Upper Extremity Strength Normal and Lower Extremity Strength Normal.    Assessment & Plan Roberto Ruff MD; 24/23/5361 6:25 PM) PRIMARY CANCER OF RECTUM (C20) Impression: We had a long discussion today about treatments for stage I rectal cancer. The lesion was noted to be T1 on rectal ultrasound. I would like to review the pathology report to make sure there are no high risk features, and also get a Chest CT scan. We will get a CEA level as well. If all of these are normal and show no signs of aggressive  features, I think it is reasonable to perform a transanal excision. He understands that if the pathology comes back as a T2 cancer, we will most likely need to proceed with low anterior resection. We discussed that there is still a risk of recurrence with T1 cancers and there is a risk of lymph node involvement of approximately 5%. There is a 10-15% chance of recurrence as well. The patient is aware of this and would like to assume these risks versus the risk of a transabdominal rectal surgery.  I reviewed the pathology reports, and the polyp was resected piecemeal and repeat biopsies are negative.  We are here today to do a full thickness excision and confirm there is no invasion deeper than the mucosa.

## 2014-11-22 NOTE — Anesthesia Postprocedure Evaluation (Signed)
  Anesthesia Post-op Note  Patient: Roberto Mitchell  Procedure(s) Performed: Procedure(s): TRANSANAL MINIMALLY INVASIVE  EXCISION OF RECTAL CANCER (N/A)  Patient Location: PACU  Anesthesia Type:General  Level of Consciousness: awake, alert  and oriented  Airway and Oxygen Therapy: Patient Spontanous Breathing  Post-op Pain: mild  Post-op Assessment: Post-op Vital signs reviewed and Patient's Cardiovascular Status Stable              Post-op Vital Signs: Reviewed  Last Vitals:  Filed Vitals:   11/22/14 1145  BP: 138/94  Pulse:   Temp:   Resp:     Complications: No apparent anesthesia complications

## 2014-11-23 DIAGNOSIS — C20 Malignant neoplasm of rectum: Secondary | ICD-10-CM | POA: Diagnosis not present

## 2014-11-23 MED ORDER — HYDROCODONE-ACETAMINOPHEN 5-325 MG PO TABS: 1.0000 | ORAL_TABLET | Freq: Four times a day (QID) | ORAL | Status: DC | PRN

## 2014-11-23 NOTE — Progress Notes (Signed)
Discharge teaching completed with teach back. Discharge instructions given and reviewed with pt. Pt. to call Monday for follow up appointment. Prescription given for Norco.  Pt. Discharged to home, left hospital via ambulation with spouse. No respiratory distress noted.

## 2014-11-23 NOTE — Discharge Summary (Signed)
Physician Discharge Summary  Patient ID: Roberto Mitchell MRN: [id number] DOB/AGE: [birth year] 54 y.o.  Admit date: 11/22/2014 Discharge date: 11/23/2014  Admission Diagnoses: Rectal cancer, s/p polypectomy  Discharge Diagnoses:  Active Problems:   Rectal cancer [facility])   Discharged Condition: good  Hospital Course: patient admitted for observation.  He did well overnight  Consults: None  Significant Diagnostic Studies: none  Treatments: IV hydration, analgesia: Vicodin and surgery: TAE  Discharge Exam: Blood pressure 114/74, pulse 92, temperature 98 F (36.7 C), temperature source Oral, resp. rate 16, height 6\' 1"  (1.854 m), weight 128.368 kg (283 lb), SpO2 96 %. General appearance: alert and cooperative GI: soft, nontender  Disposition: Home    Medication List    TAKE these medications        aspirin EC 81 MG tablet  Take 81 mg by mouth daily.     celecoxib 200 MG capsule  Commonly known as:  CELEBREX  Take 200 mg by mouth daily.     fluvastatin XL 80 MG 24 hr tablet  Commonly known as:  LESCOL XL  Take 80 mg by mouth daily.     hydrochlorothiazide 12.5 MG tablet  Commonly known as:  HYDRODIURIL  TAKE 1 TABLET BY MOUTH DAILY.     HYDROcodone-acetaminophen 5-325 MG tablet  Commonly known as:  NORCO/VICODIN  Take 1-2 tablets by mouth every 6 (six) hours as needed for moderate pain.     levothyroxine 125 MCG tablet  Commonly known as:  SYNTHROID, LEVOTHROID  Take 125 mcg by mouth daily before breakfast.     Multiple Vitamins tablet  Take 1 tablet by mouth daily.     ROLAIDS PO  Take 1-2 tablets by mouth daily as needed (heartburn).       Follow-up Information    Follow up with Rosario Adie., MD. Schedule an appointment as soon as possible for a visit in 3 weeks.   Specialty:  General Surgery   Contact information:   [place] [address] [phone]       Signed: Rosario Adie 46/02/8636, 17:71 AM

## 2014-11-23 NOTE — Plan of Care (Signed)
Problem: Nutrition: Goal: Adequate nutrition will be maintained Outcome: Completed/Met Date Met:  11/23/14 Good appetite, tolerating regular diet

## 2014-11-23 NOTE — Plan of Care (Signed)
Problem: Pain Managment: Goal: General experience of comfort will improve Outcome: Progressing Pain controlled with norco tablets.

## 2014-11-23 NOTE — Discharge Instructions (Signed)
Beginning the day after surgery: ? ?You may sit in a tub of warm water 2-3 times a day to relieve discomfort. ? ?Eat a regular diet high in fiber.  Avoid foods that give you constipation or diarrhea.  Avoid foods that are difficult to digest, such as seeds, nuts, corn or popcorn. ? ?Do not go any longer than 2 days without a bowel movement.  You may take a dose of Milk of Magnesia if you become constipated.   ? ?Drink 6-8 glasses of water daily. ? ?Walking is encouraged.  Avoid strenuous activity and heavy lifting for one month after surgery.   ? ?Call the office if you have any questions or concerns.  Call immediately if you develop: ? ?Excessive rectal bleeding (more than a cup or passing large clots) ?Increased discomfort ?Fever greater than 100 F ?Difficulty urinating  ?

## 2015-09-09 ENCOUNTER — Other Ambulatory Visit: Payer: Self-pay | Admitting: General Surgery

## 2015-10-02 ENCOUNTER — Encounter (HOSPITAL_COMMUNITY): Payer: Self-pay

## 2015-10-02 ENCOUNTER — Encounter (HOSPITAL_COMMUNITY)
Admission: RE | Disposition: A | Discharge status: Home / Self Care | Payer: Self-pay | Source: Ambulatory Visit | Attending: General Surgery

## 2015-10-02 ENCOUNTER — Ambulatory Visit (HOSPITAL_COMMUNITY)
Admission: RE | Admit: 2015-10-02 | Discharge: 2015-10-02 | Disposition: A | Discharge status: Home / Self Care | Payer: BLUE CROSS/BLUE SHIELD | Source: Ambulatory Visit | Attending: General Surgery | Admitting: General Surgery

## 2015-10-02 DIAGNOSIS — I10 Essential (primary) hypertension: Secondary | ICD-10-CM | POA: Diagnosis not present

## 2015-10-02 DIAGNOSIS — Z7982 Long term (current) use of aspirin: Secondary | ICD-10-CM | POA: Insufficient documentation

## 2015-10-02 DIAGNOSIS — D120 Benign neoplasm of cecum: Secondary | ICD-10-CM | POA: Insufficient documentation

## 2015-10-02 DIAGNOSIS — Z791 Long term (current) use of non-steroidal anti-inflammatories (NSAID): Secondary | ICD-10-CM | POA: Insufficient documentation

## 2015-10-02 DIAGNOSIS — Z85048 Personal history of other malignant neoplasm of rectum, rectosigmoid junction, and anus: Secondary | ICD-10-CM | POA: Insufficient documentation

## 2015-10-02 DIAGNOSIS — M1990 Unspecified osteoarthritis, unspecified site: Secondary | ICD-10-CM | POA: Insufficient documentation

## 2015-10-02 DIAGNOSIS — K633 Ulcer of intestine: Secondary | ICD-10-CM | POA: Diagnosis not present

## 2015-10-02 DIAGNOSIS — K5730 Diverticulosis of large intestine without perforation or abscess without bleeding: Secondary | ICD-10-CM | POA: Insufficient documentation

## 2015-10-02 DIAGNOSIS — E0590 Thyrotoxicosis, unspecified without thyrotoxic crisis or storm: Secondary | ICD-10-CM | POA: Insufficient documentation

## 2015-10-02 DIAGNOSIS — Z1211 Encounter for screening for malignant neoplasm of colon: Secondary | ICD-10-CM | POA: Insufficient documentation

## 2015-10-02 DIAGNOSIS — Z79899 Other long term (current) drug therapy: Secondary | ICD-10-CM | POA: Diagnosis not present

## 2015-10-02 HISTORY — PX: COLONOSCOPY: SHX5424

## 2015-10-02 HISTORY — DX: Sleep apnea, unspecified: G47.30

## 2015-10-02 SURGERY — COLONOSCOPY
Anesthesia: Moderate Sedation

## 2015-10-02 MED ORDER — LACTATED RINGERS IV SOLN: INTRAVENOUS | Status: DC

## 2015-10-02 MED ORDER — FENTANYL CITRATE (PF) 100 MCG/2ML IJ SOLN
INTRAMUSCULAR | Status: DC | PRN
  Administered 2015-10-02 (×4): 25 ug via INTRAVENOUS

## 2015-10-02 MED ORDER — SODIUM CHLORIDE 0.9 % IV SOLN
INTRAVENOUS | Status: DC
  Administered 2015-10-02: 500 mL via INTRAVENOUS

## 2015-10-02 MED ORDER — MIDAZOLAM HCL 5 MG/ML IJ SOLN
INTRAMUSCULAR | Status: AC
  Filled 2015-10-02: qty 2

## 2015-10-02 MED ORDER — MIDAZOLAM HCL 10 MG/2ML IJ SOLN
INTRAMUSCULAR | Status: DC | PRN
  Administered 2015-10-02: 1 mg via INTRAVENOUS
  Administered 2015-10-02 (×2): 2 mg via INTRAVENOUS

## 2015-10-02 MED ORDER — FENTANYL CITRATE (PF) 100 MCG/2ML IJ SOLN
INTRAMUSCULAR | Status: AC
  Filled 2015-10-02: qty 2

## 2015-10-02 MED ORDER — DIPHENHYDRAMINE HCL 50 MG/ML IJ SOLN
INTRAMUSCULAR | Status: AC
  Filled 2015-10-02: qty 1

## 2015-10-02 NOTE — Discharge Instructions (Signed)
Post Colonoscopy Instructions ° °1. DIET: Follow a light bland diet the first 24 hours after arrival home, such as soup, liquids, crackers, etc.  Be sure to include lots of fluids daily.  Avoid fast food or heavy meals as your are more likely to get nauseated.   °2. You may have some mild rectal bleeding for the first few days after the procedure.  This should get less and less with time.  Resume any blood thinners 2 days after your procedure unless directed otherwise by your physician. °3. Take your usually prescribed home medications unless otherwise directed. °a. If you have any pain, it is helpful to get up and walk around, as it is usually from excess gas. °b. If this is not helpful, you can take an over-the-counter pain medication.  Choose one of the following that works best for you: °i. Naproxen (Aleve, etc)  Two 220mg tabs twice a day °ii. Ibuprofen (Advil, etc) Three 200mg tabs four times a day (every meal & bedtime) °iii. If you still have pain after using one of these, please call the office °4. It is normal to not have a bowel movement for 2-3 days after colonoscopy.   ° °5. ACTIVITIES as tolerated:   °6. You may resume regular (light) daily activities beginning the next day--such as daily self-care, walking, climbing stairs--gradually increasing activities as tolerated.  ° ° °WHEN TO CALL US [phone]: °1. Fever over 101.5 F (38.5 C)  °2. Severe abdominal or chest pain  °3. Large amount of rectal bleeding, passing multiple blood clots  °4. Dizziness or shortness of breath °5. Increasing nausea or vomiting ° ° The clinic staff is available to answer your questions during regular business hours (8:30am-5pm).  Please don’t hesitate to call and ask to speak to one of our nurses for clinical concerns.  ° If you have a medical emergency, go to the nearest emergency room or call 911. ° A surgeon from [facility] is always on call at the hospitals ° ° °[facility] °[address], [address], [place], [place]  [address] ? °MAIN: [phone] ? TOLL FREE: [phone] ?  °FAX [phone] °[email or web address] ° ° °

## 2015-10-02 NOTE — Op Note (Signed)
[facility] Patient Name: Roberto Mitchell Procedure Date: 10/02/2015 MRN: [id number] Attending MD: Leighton Ruff , MD Date of Birth: Jan 20, 1960 CSN: [id number] Age: 55 Admit Type: Outpatient Procedure:                Colonoscopy Indications:              High risk colon cancer surveillance: Personal                            history of rectal cancer, Last colonoscopy 1 year                            ago Providers:                Leighton Ruff, MD, Hilma Favors, RN, [facility]                            Tech, Technician Referring MD:              Medicines:                Fentanyl 100 micrograms IV, Midazolam 5 mg IV Complications:            No immediate complications. Estimated Blood Loss:     Estimated blood loss was minimal. Procedure:                Pre-Anesthesia Assessment:                           - Prior to the procedure, a History and Physical                            was performed, and patient medications and                            allergies were reviewed. The patient's tolerance of                            previous anesthesia was also reviewed. The risks                            and benefits of the procedure and the sedation                            options and risks were discussed with the patient.                            All questions were answered, and informed consent                            was obtained. Prior Anticoagulants: The patient has                            taken aspirin. ASA Grade Assessment: II - A patient  with mild systemic disease. After reviewing the                            risks and benefits, the patient was deemed in                            satisfactory condition to undergo the procedure.                           - Prior to the procedure, a History and Physical                            was performed, and patient medications, allergies                            and sensitivities  were reviewed. The patient's                            tolerance of previous anesthesia was reviewed.                           - The risks and benefits of the procedure and the                            sedation options and risks were discussed with the                            patient. All questions were answered and informed                            consent was obtained.                           - Patient identification and proposed procedure                            were verified prior to the procedure by the                            physician, the nurse and the technician. The                            procedure was verified in the endoscopy suite.                           - Sedation was administered by an endoscopy nurse.                            The sedation level attained was moderate.                           - The heart rate, respiratory rate, oxygen  saturations, blood pressure, adequacy of pulmonary                            ventilation, and response to care were monitored                            throughout the procedure.                           After obtaining informed consent, the colonoscope                            was passed under direct vision. Throughout the                            procedure, the patient's blood pressure, pulse, and                            oxygen saturations were monitored continuously. The                            [id number]) scope was introduced through                            the anus and advanced to the the cecum, identified                            by appendiceal orifice and ileocecal valve. The                            colonoscopy was performed without difficulty. The                            patient tolerated the procedure well. The quality                            of the bowel preparation was excellent. The entire                            colon was well visualized.  Scope withdrawal time                            was 24 minutes. The terminal ileum and the                            appendiceal orifice were photographed. Scope In: [id number] Scope Out: 8:17:13 AM Scope Withdrawal Time: 0 hours 23 minutes 42 seconds  Total Procedure Duration: 0 hours 30 minutes 19 seconds  Findings:      The perianal and digital rectal examinations were normal. Pertinent       negatives include normal sphincter tone.      A 2 mm polyp was found in the ileocecal valve. The polyp was       pedunculated. The polyp was removed with a cold biopsy forceps.  Resection and retrieval were complete.      A few small-mouthed diverticula were found in the sigmoid colon.      A 3 mm polyp was found in the rectum (benign-appearing lesion). The       polyp was pedunculated. The polyp was removed with a cold snare. Polyp       resection was incomplete. The resected tissue was retrieved. Impression:               - One 2 mm polyp at the ileocecal valve, removed                            with a cold biopsy forceps. Resected and retrieved.                           - Diverticulosis in the sigmoid colon.                           - One benign appearing 3 mm polyp in the rectum,                            removed with a cold snare. Incomplete resection.                            Resected tissue retrieved. Moderate Sedation:      Moderate (conscious) sedation was administered by the endoscopy nurse       and supervised by the endoscopist. The following parameters were       monitored: oxygen saturation, heart rate, blood pressure, and response       to care. Recommendation:           - Await pathology results.                           - Repeat colonoscopy date to be determined after                            pending pathology results are reviewed for                            surveillance.                           - Return to my office as previously scheduled.                            - Discharge patient to home (ambulatory).                           - Written discharge instructions were provided to                            the patient.                           - Advance diet as tolerated today.                           -  Continue present medications. Procedure Code(s):        --- Professional ---                           [phone], Colonoscopy, flexible; with removal of                            tumor(s), polyp(s), or other lesion(s) by snare                            technique                           45380, 20, Colonoscopy, flexible; with biopsy,                            single or multiple Diagnosis Code(s):        --- Professional ---                           Z85.048, Personal history of other malignant                            neoplasm of rectum, rectosigmoid junction, and anus                           D12.0, Benign neoplasm of cecum                           K62.1, Rectal polyp                           K57.30, Diverticulosis of large intestine without                            perforation or abscess without bleeding CPT copyright 2016 American Medical Association. All rights reserved. The codes documented in this report are preliminary and upon coder review may  be revised to meet current compliance requirements. Leighton Ruff, MD Leighton Ruff, MD [id number] 8:27:47 AM This report has been signed electronically. Number of Addenda: 0

## 2015-10-02 NOTE — H&P (Signed)
     History of Present Illness  The patient is a 55 year old male presenting for a post-operative visit. Patient status post transanal excision of polypectomy scar after piecemeal resection of a T1 rectal cancer in the anterior left side at the first valve of [place]. Since that time he is having good bowel function. He denies any rectal bleeding. He has been recently diagnosed with a kidney stone and has had some constipation after taking narcotics.  Past Medical History:  Diagnosis Date  . Arthritis   . Cancer ([place])   . Hypertension   . Hyperthyroidism   . Rectal cancer [facility])    Past Surgical History:  Procedure Laterality Date  . REFRACTIVE SURGERY    . TRANSANAL EXCISION OF RECTAL MASS N/A 11/22/2014   Procedure: TRANSANAL MINIMALLY INVASIVE  EXCISION OF RECTAL CANCER;  Surgeon: Leighton Ruff, MD;  Location: [facility];  Service: General;  Laterality: N/A;    Allergies  No Known Drug Allergies10/11/2014  Medication History  Celecoxib (200MG  Capsule, Oral) Active. Hydrochlorothiazide (12.5MG  Tablet, Oral) Active. Fluvastatin Sodium ER (80MG  Tablet ER 24HR, Oral) Active. Levothyroxine Sodium (125MCG Tablet, Oral) Active. Aspirin (81MG  Tablet, Oral) Active. Multiple Vitamins (Oral) Active. Medications Reconciled History reviewed. No pertinent family history. Social History   Social History  . Marital status: Married    Spouse name: N/A  . Number of children: N/A  . Years of education: N/A   Occupational History  . Not on file.   Social History Main Topics  . Smoking status: Never Smoker  . Smokeless tobacco: Current User    Types: Snuff  . Alcohol use Yes     Comment: rare  . Drug use: No  . Sexual activity: Not on file   Other Topics Concern  . Not on file   Social History Narrative  . No narrative on file   Review of Systems - General ROS: negative for - chills or fever Respiratory ROS: no cough, shortness of breath, or wheezing Cardiovascular  ROS: no chest pain or dyspnea on exertion Gastrointestinal ROS: no abdominal pain, change in bowel habits, or black or bloody stools Genito-Urinary ROS: no dysuria, trouble voiding, or hematuria  BP (!) 153/93   Pulse 60   Temp 97.9 F (36.6 C) (Oral)   Resp 19   Ht 6\' 1"  (1.854 m)   Wt 128.4 kg (283 lb)   SpO2 98%   BMI 37.34 kg/m      Physical Exam  General Mental Status-Alert. General Appearance-Not in acute distress. Build & Nutrition-Well nourished. Posture-Normal posture. Gait-Normal.  Head and Neck Head-normocephalic, atraumatic with no lesions or palpable masses. Trachea-midline.  CV: RRR  Lungs: CTA  Abdomen Inspection Inspection of the abdomen reveals - No Hernias. Palpation/Percussion Palpation and Percussion of the abdomen reveal - Soft, Non Tender, No Rigidity (guarding), No hepatosplenomegaly and No Palpable abdominal masses.  Neurologic Neurologic evaluation reveals -alert and oriented x 3 with no impairment of recent or remote memory, normal attention span and ability to concentrate, normal sensation and normal coordination.  Musculoskeletal Normal Exam - Bilateral-Upper Extremity Strength Normal and Lower Extremity Strength Normal.    Assessment & Plan Leighton Ruff MD; 0000000 3:38 PM) HISTORY OF RECTAL CANCER (Z85.048) Impression: 55 year old male status post transanal excision of a piecemeal resected stage I rectal cancer. Here for surveillance colonoscopy.  Risks of bleeding, perforation and missed pathology explained to the patient.  He agrees to proceed.

## 2016-08-04 IMAGING — CT CT CHEST W/ CM
2 of 4 series · 15 of 36 positions shown, 18 images · IV contrast (Omni 300)
Comparison: CT scan abdomen/ pelvis 10/09/2014

CLINICAL DATA: Rectal cancer found on recent colonoscopy. Evaluate
for metastatic disease involving the chest.

EXAM:
CT CHEST WITH CONTRAST
TECHNIQUE: Multidetector CT imaging of the chest was performed during
intravenous contrast administration.
CONTRAST:  75mL OMNIPAQUE IOHEXOL 300 MG/ML  SOLN

[Series 2: chest with 5mm st · axial · 0.89mm/px · z∈[-500,-180]mm · 12 of 76 slices shown, 15 images]
[im 6/76  mediastinal]
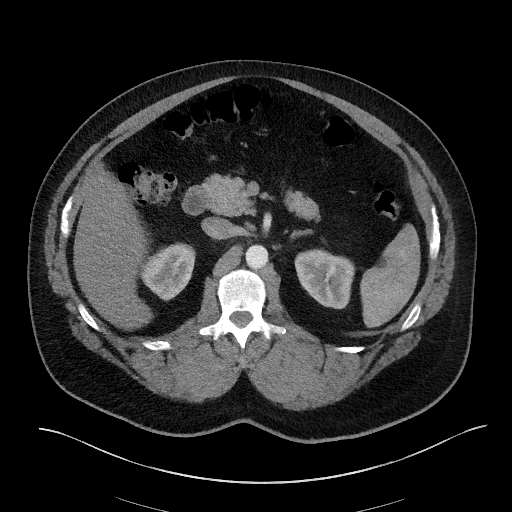
[im 6/76  lung]
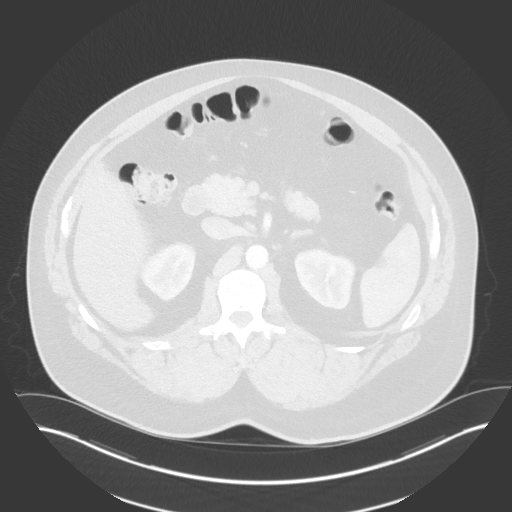
[im 11/76  lung]
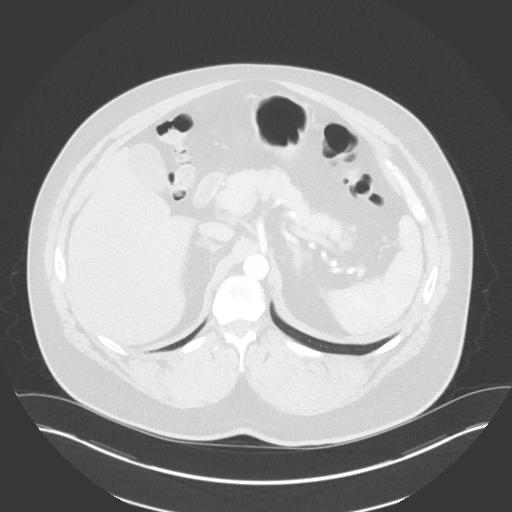
[im 17/76  lung]
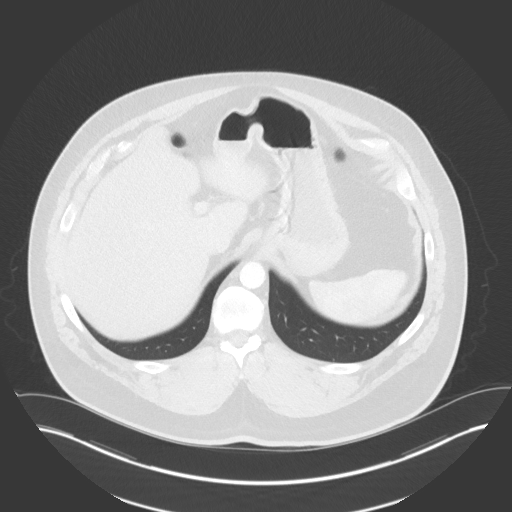
[im 22/76  lung]
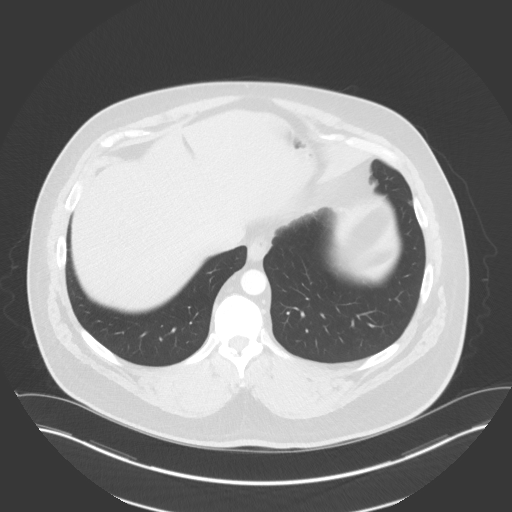
[im 27/76  mediastinal]
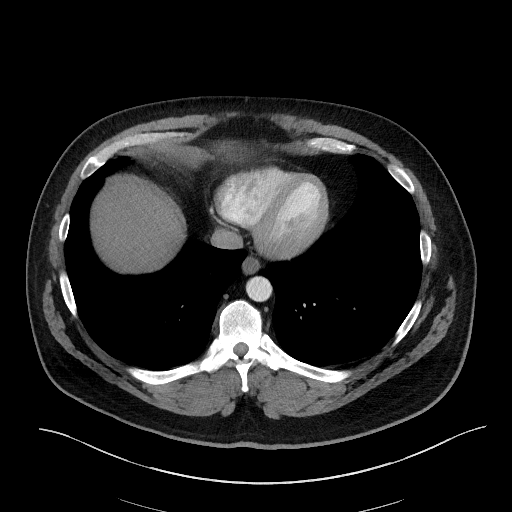
[im 27/76  lung]
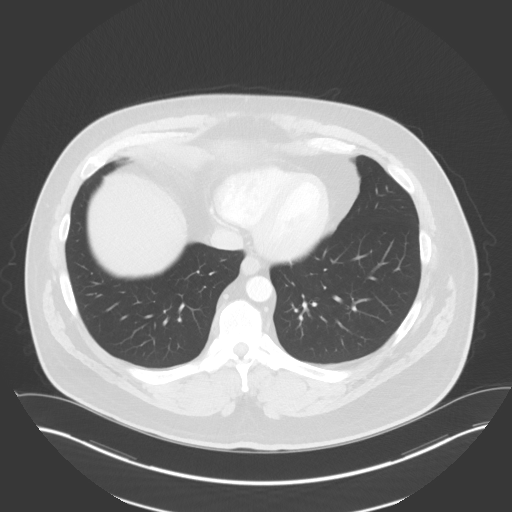
[im 33/76  lung]
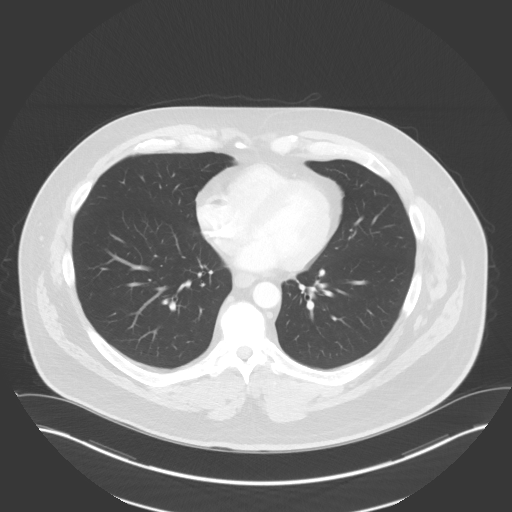
[im 43/76  lung]
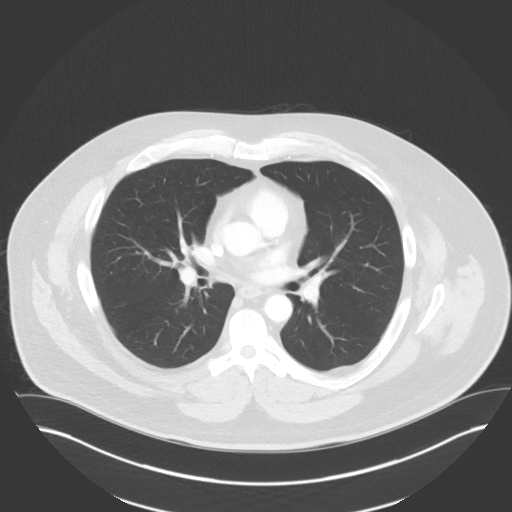
[im 49/76  lung]
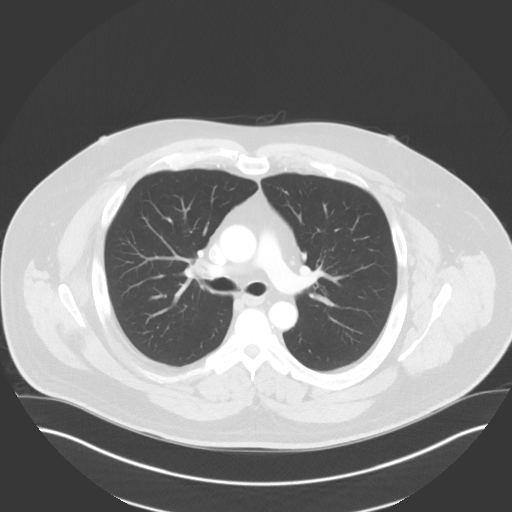
[im 54/76  mediastinal]
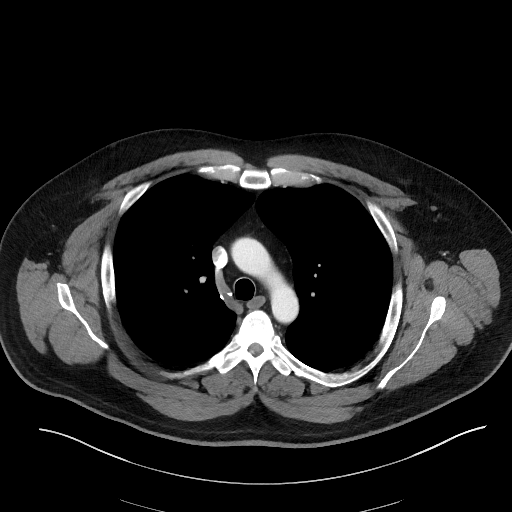
[im 54/76  lung]
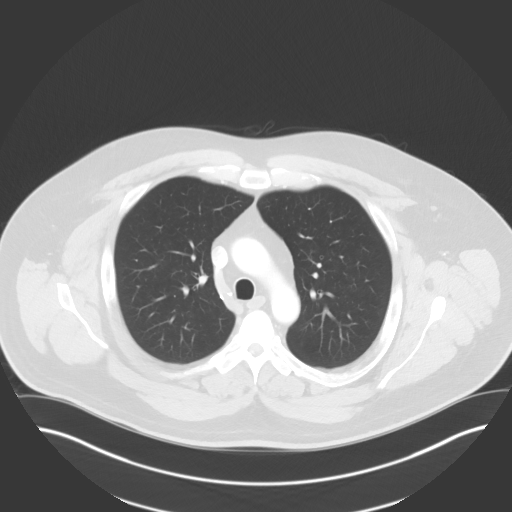
[im 59/76  lung]
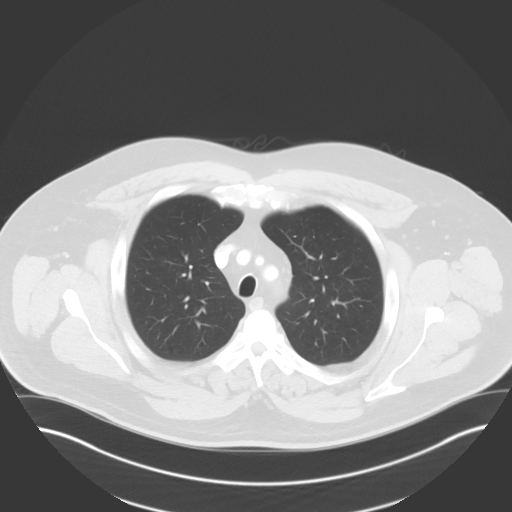
[im 65/76  lung]
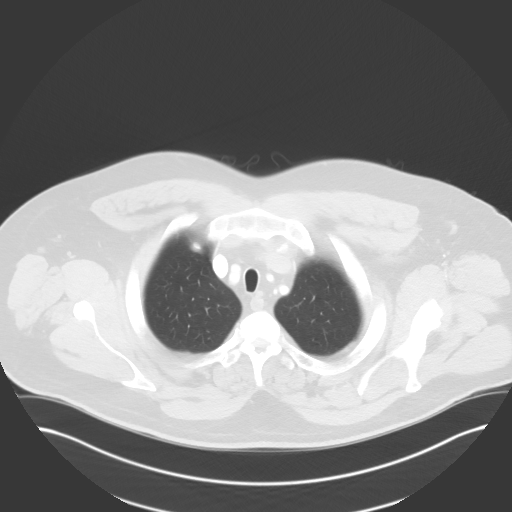
[im 70/76  lung]
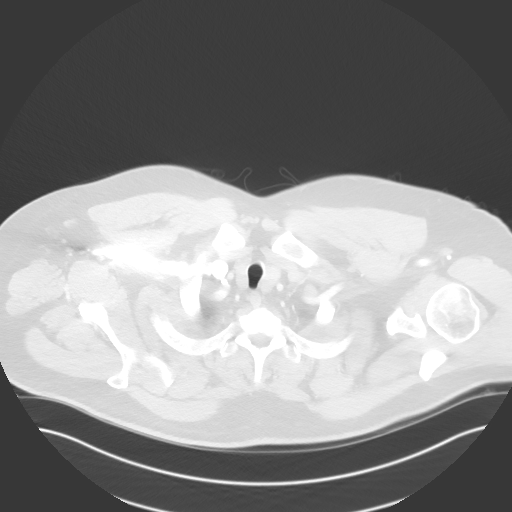

[Series 5: chest with 3mm st cor · coronal · 0.70mm/px · 3 of 95 slices shown]
[im 19/95  lung]
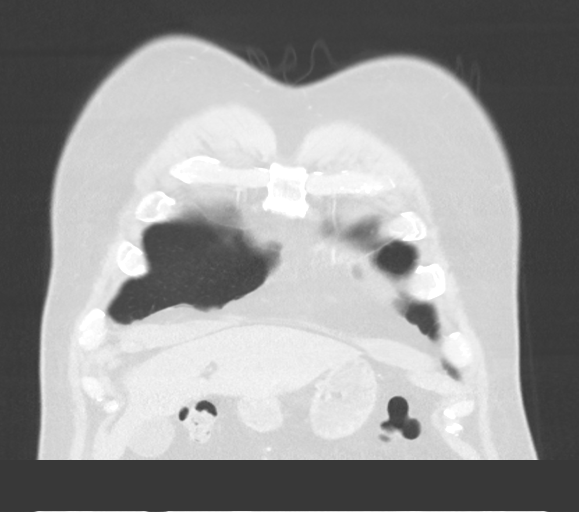
[im 38/95  lung]
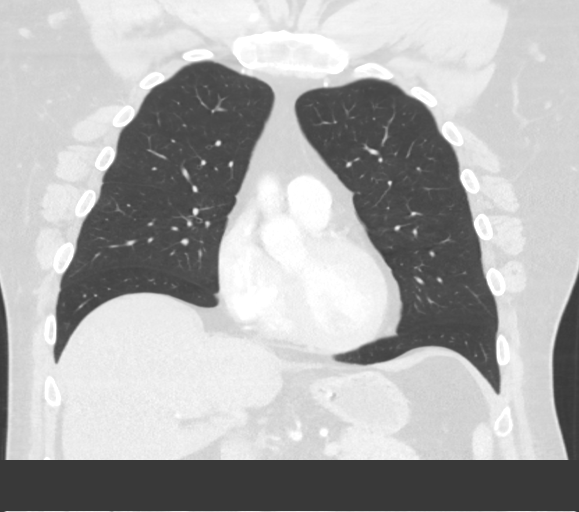
[im 57/95  lung]
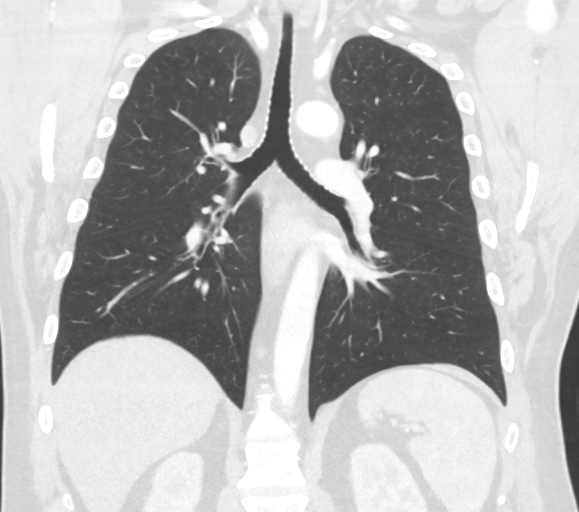

[15 of 36 positions shown; findings below may reference images not displayed]

FINDINGS: Mediastinum/Nodes: No chest wall mass, supraclavicular or axillary
lymphadenopathy. Small scattered lymph nodes are noted. The thyroid
gland appears normal.

The heart is normal in size. No pericardial effusion. The aorta is
normal in caliber. No dissection. The branch vessels are normal.
Small calcifications are noted in the azygos vein. The esophagus is
normal.

Lungs/Pleura: The lungs are clear of acute process. No infiltrates,
edema or effusions. No findings suspicious for metastatic pulmonary
disease. Small calcified granuloma noted in the right upper lobe. No
pleural effusions or pleural lesions. No bronchiectasis or
interstitial lung disease. The tracheobronchial tree is
unremarkable.

Upper abdomen: No significant upper abdominal findings.

Musculoskeletal: No significant bony findings.
IMPRESSION: No CT findings for metastatic disease involving the chest.

## 2018-08-22 ENCOUNTER — Ambulatory Visit: Payer: Self-pay | Admitting: General Surgery

## 2018-10-31 ENCOUNTER — Other Ambulatory Visit (HOSPITAL_COMMUNITY)
Admission: RE | Admit: 2018-10-31 | Discharge: 2018-10-31 | Disposition: A | Discharge status: Home / Self Care | Payer: BC Managed Care – PPO | Source: Ambulatory Visit | Attending: General Surgery | Admitting: General Surgery

## 2018-10-31 DIAGNOSIS — Z20828 Contact with and (suspected) exposure to other viral communicable diseases: Secondary | ICD-10-CM | POA: Insufficient documentation

## 2018-10-31 DIAGNOSIS — Z01812 Encounter for preprocedural laboratory examination: Secondary | ICD-10-CM | POA: Diagnosis not present

## 2018-11-01 LAB — NOVEL CORONAVIRUS, NAA (HOSP ORDER, SEND-OUT TO REF LAB; TAT 18-24 HRS): SARS-CoV-2, NAA: NOT DETECTED

## 2018-11-02 ENCOUNTER — Encounter (HOSPITAL_COMMUNITY): Payer: Self-pay | Admitting: Emergency Medicine

## 2018-11-02 ENCOUNTER — Other Ambulatory Visit: Payer: Self-pay

## 2018-11-02 NOTE — Progress Notes (Signed)
Pre-op endo call completed.  Patient unaware of any instructions  for his aspirin, RN  instructed patient to call dr Marcello Moores office today

## 2018-11-02 NOTE — Progress Notes (Signed)
Called to check Covid quarantine, patient has been home and is not symptomatic.

## 2018-11-03 ENCOUNTER — Encounter (HOSPITAL_COMMUNITY): Payer: Self-pay | Admitting: *Deleted

## 2018-11-03 ENCOUNTER — Encounter (HOSPITAL_COMMUNITY)
Admission: RE | Disposition: A | Discharge status: Home / Self Care | Payer: Self-pay | Source: Home / Self Care | Attending: General Surgery

## 2018-11-03 ENCOUNTER — Other Ambulatory Visit: Payer: Self-pay

## 2018-11-03 ENCOUNTER — Ambulatory Visit (HOSPITAL_COMMUNITY)
Admission: RE | Admit: 2018-11-03 | Discharge: 2018-11-03 | Disposition: A | Discharge status: Home / Self Care | Payer: BC Managed Care – PPO | Attending: General Surgery | Admitting: General Surgery

## 2018-11-03 DIAGNOSIS — K5730 Diverticulosis of large intestine without perforation or abscess without bleeding: Secondary | ICD-10-CM | POA: Diagnosis not present

## 2018-11-03 DIAGNOSIS — I10 Essential (primary) hypertension: Secondary | ICD-10-CM | POA: Diagnosis not present

## 2018-11-03 DIAGNOSIS — Z7982 Long term (current) use of aspirin: Secondary | ICD-10-CM | POA: Diagnosis not present

## 2018-11-03 DIAGNOSIS — M1990 Unspecified osteoarthritis, unspecified site: Secondary | ICD-10-CM | POA: Insufficient documentation

## 2018-11-03 DIAGNOSIS — Z1211 Encounter for screening for malignant neoplasm of colon: Secondary | ICD-10-CM | POA: Diagnosis present

## 2018-11-03 DIAGNOSIS — Z7984 Long term (current) use of oral hypoglycemic drugs: Secondary | ICD-10-CM | POA: Insufficient documentation

## 2018-11-03 DIAGNOSIS — Z85048 Personal history of other malignant neoplasm of rectum, rectosigmoid junction, and anus: Secondary | ICD-10-CM | POA: Insufficient documentation

## 2018-11-03 DIAGNOSIS — K635 Polyp of colon: Secondary | ICD-10-CM | POA: Diagnosis not present

## 2018-11-03 DIAGNOSIS — G4730 Sleep apnea, unspecified: Secondary | ICD-10-CM | POA: Diagnosis not present

## 2018-11-03 DIAGNOSIS — Z79899 Other long term (current) drug therapy: Secondary | ICD-10-CM | POA: Insufficient documentation

## 2018-11-03 HISTORY — PX: BIOPSY: SHX5522

## 2018-11-03 HISTORY — PX: COLONOSCOPY: SHX5424

## 2018-11-03 LAB — GLUCOSE, CAPILLARY: Glucose-Capillary: 102 mg/dL — ABNORMAL HIGH (ref 70–99)

## 2018-11-03 SURGERY — COLONOSCOPY
Anesthesia: IV Sedation (MBSC Only)

## 2018-11-03 MED ORDER — SODIUM CHLORIDE 0.9 % IV SOLN: INTRAVENOUS | Status: DC

## 2018-11-03 MED ORDER — MIDAZOLAM HCL (PF) 10 MG/2ML IJ SOLN
INTRAMUSCULAR | Status: DC | PRN
  Administered 2018-11-03 (×6): 2 mg via INTRAVENOUS

## 2018-11-03 MED ORDER — FENTANYL CITRATE (PF) 100 MCG/2ML IJ SOLN
INTRAMUSCULAR | Status: AC
  Filled 2018-11-03: qty 2

## 2018-11-03 MED ORDER — FENTANYL CITRATE (PF) 100 MCG/2ML IJ SOLN
INTRAMUSCULAR | Status: DC | PRN
  Administered 2018-11-03 (×4): 25 ug via INTRAVENOUS

## 2018-11-03 MED ORDER — MIDAZOLAM HCL (PF) 5 MG/ML IJ SOLN
INTRAMUSCULAR | Status: AC
  Filled 2018-11-03: qty 1

## 2018-11-03 MED ORDER — SODIUM CHLORIDE 0.9% FLUSH: 3.0000 mL | Freq: Two times a day (BID) | INTRAVENOUS | Status: DC

## 2018-11-03 MED ORDER — MIDAZOLAM HCL (PF) 5 MG/ML IJ SOLN
INTRAMUSCULAR | Status: AC
  Filled 2018-11-03: qty 2

## 2018-11-03 NOTE — Op Note (Signed)
[facility] Patient Name: Roberto Mitchell Procedure Date: 11/03/2018 MRN: [id number] Attending MD: Leighton Ruff , MD Date of Birth: 1960/07/12 CSN: [id number] Age: 58 Admit Type: Outpatient Procedure:                Colonoscopy Indications:              High risk colon cancer surveillance: Personal                            history of rectal cancer Providers:                Leighton Ruff, MD, Cleda Daub, RN, Cherylynn Ridges, Technician Referring MD:              Medicines:                Midazolam 12 mg IV, Fentanyl 100 micrograms IV,                            Monitored Anesthesia Care Complications:            No immediate complications. Estimated blood loss:                            Minimal. Estimated Blood Loss:      Procedure:                Pre-Anesthesia Assessment:                           - Prior to the procedure, a History and Physical                            was performed, and patient medications and                            allergies were reviewed. The patient's tolerance of                            previous anesthesia was also reviewed. The risks                            and benefits of the procedure and the sedation                            options and risks were discussed with the patient.                            All questions were answered, and informed consent                            was obtained. Prior Anticoagulants: The patient has                            taken no previous anticoagulant or antiplatelet  agents. ASA Grade Assessment: II - A patient with                            mild systemic disease. After reviewing the risks                            and benefits, the patient was deemed in                            satisfactory condition to undergo the procedure.                           After obtaining informed consent, the colonoscope                            was  passed under direct vision. Throughout the                            procedure, the patient's blood pressure, pulse, and                            oxygen saturations were monitored continuously. The                            [id number]) Olympus colonoscope was                            introduced through the anus and advanced to the the                            terminal ileum, with identification of the                            appendiceal orifice and IC valve. The colonoscopy                            was performed without difficulty. The patient                            tolerated the procedure well. The quality of the                            bowel preparation was adequate. Scope withdrawal                            time was 27 minutes. Scope In: 8:49:04 AM Scope Out: 9:36:53 AM Scope Withdrawal Time: 0 hours 27 minutes 3 seconds  Total Procedure Duration: 0 hours 47 minutes 49 seconds  Findings:      The perianal and digital rectal examinations were normal.      Two sessile polyps were found in the sigmoid colon. The polyps were 3 mm       in size. These polyps were removed with a cold biopsy forceps. Resection       and retrieval were complete.  Multiple small-mouthed diverticula were found in the sigmoid colon.      The exam was otherwise without abnormality.      There was evidence of a prior transanal excision anastomosis in the       distal rectum. This was patent. Impression:               - Two 3 mm polyps in the sigmoid colon, removed                            with a cold biopsy forceps. Resected and retrieved.                           - Diverticulosis in the sigmoid colon.                           - The examination was otherwise normal. Moderate Sedation:      Moderate (conscious) sedation was administered by the endoscopy nurse       and supervised by the endoscopist. The following parameters were       monitored: oxygen saturation, heart rate,  blood pressure, and response       to care. Recommendation:           - Discharge patient to home (ambulatory).                           - Resume previous diet.                           - Continue present medications.                           - Await pathology results.                           - Repeat colonoscopy in 5 years for surveillance                            based on pathology results. Procedure Code(s):        --- Professional ---                           [phone], Colonoscopy, flexible; with biopsy, single                            or multiple Diagnosis Code(s):        --- Professional ---                           [id number], Personal history of other malignant                            neoplasm of rectum, rectosigmoid junction, and anus                           K57.30, Diverticulosis of large intestine without  perforation or abscess without bleeding                           K63.5, Polyp of colon CPT copyright 2019 American Medical Association. All rights reserved. The codes documented in this report are preliminary and upon coder review may  be revised to meet current compliance requirements. Leighton Ruff, MD Leighton Ruff, MD [id number] 9:48:09 AM This report has been signed electronically. Number of Addenda: 0

## 2018-11-03 NOTE — Discharge Instructions (Signed)
Post Colonoscopy Instructions ° °1. DIET: Follow a light bland diet the first 24 hours after arrival home, such as soup, liquids, crackers, etc.  Be sure to include lots of fluids daily.  Avoid fast food or heavy meals as your are more likely to get nauseated.   °2. You may have some mild rectal bleeding for the first few days after the procedure.  This should get less and less with time.  Resume any blood thinners 2 days after your procedure unless directed otherwise by your physician. °3. Take your usually prescribed home medications unless otherwise directed. °a. If you have any pain, it is helpful to get up and walk around, as it is usually from excess gas. °b. If this is not helpful, you can take an over-the-counter pain medication.  Choose one of the following that works best for you: °i. Naproxen (Aleve, etc)  Two 220mg tabs twice a day °ii. Ibuprofen (Advil, etc) Three 200mg tabs four times a day (every meal & bedtime) °iii. If you still have pain after using one of these, please call the office °4. It is normal to not have a bowel movement for 2-3 days after colonoscopy.   ° °5. ACTIVITIES as tolerated:   °6. You may resume regular (light) daily activities beginning the next day--such as daily self-care, walking, climbing stairs--gradually increasing activities as tolerated.  ° ° °WHEN TO CALL US [phone]: °1. Fever over 101.5 F (38.5 C)  °2. Severe abdominal or chest pain  °3. Large amount of rectal bleeding, passing multiple blood clots  °4. Dizziness or shortness of breath °5. Increasing nausea or vomiting ° ° The clinic staff is available to answer your questions during regular business hours (8:30am-5pm).  Please don’t hesitate to call and ask to speak to one of our nurses for clinical concerns.  ° If you have a medical emergency, go to the nearest emergency room or call 911. ° A surgeon from [facility] is always on call at the hospitals ° ° °[facility] °[address], [address], [place], [place]  [address] ? °MAIN: [phone] ? TOLL FREE: [phone] ?  °FAX [phone] °[email or web address] ° ° °

## 2018-11-03 NOTE — H&P (Signed)
58 y.o. M with h/o rectal cancer.  Patient status post transanal excision of polypectomy scar after piecemeal resection of a T1 rectal cancer in the anterior left side at the first valve of [place] ~3 yrs ago. Since that time he has had good bowel function. He denies any rectal bleeding. No abnormal weight loss or change in bowel function. He is due for repeat colonoscopy.  Past Medical History:  Diagnosis Date  . Arthritis   . Cancer ([place])   . Hypertension   . Hyperthyroidism   . Rectal cancer ([place])   . Sleep apnea    cpap  in use    Past Surgical History:  Procedure Laterality Date  . COLONOSCOPY N/A 10/02/2015   Procedure: COLONOSCOPY;  Surgeon: Leighton Ruff, MD;  Location: [facility];  Service: Endoscopy;  Laterality: N/A;  . REFRACTIVE SURGERY    . TRANSANAL EXCISION OF RECTAL MASS N/A 11/22/2014   Procedure: TRANSANAL MINIMALLY INVASIVE  EXCISION OF RECTAL CANCER;  Surgeon: Leighton Ruff, MD;  Location: [facility];  Service: General;  Laterality: N/A;   History reviewed. No pertinent family history. Social History   Socioeconomic History  . Marital status: Married    Spouse name: Not on file  . Number of children: Not on file  . Years of education: Not on file  . Highest education level: Not on file  Occupational History  . Not on file  Social Needs  . Financial resource strain: Not on file  . Food insecurity    Worry: Not on file    Inability: Not on file  . Transportation needs    Medical: Not on file    Non-medical: Not on file  Tobacco Use  . Smoking status: Never Smoker  . Smokeless tobacco: Current User    Types: Snuff  Substance and Sexual Activity  . Alcohol use: Yes    Comment: occ  . Drug use: No  . Sexual activity: Not on file  Lifestyle  . Physical activity    Days per week: Not on file    Minutes per session: Not on file  . Stress: Not on file  Relationships  . Social Herbalist on phone: Not on file    Gets together: Not on file     Attends religious service: Not on file    Active member of club or organization: Not on file    Attends meetings of clubs or organizations: Not on file    Relationship status: Not on file  . Intimate partner violence    Fear of current or ex partner: Not on file    Emotionally abused: Not on file    Physically abused: Not on file    Forced sexual activity: Not on file  Other Topics Concern  . Not on file  Social History Narrative  . Not on file     No Known Allergies   Current Meds  Medication Sig  . acetaminophen (TYLENOL) 500 MG tablet Take 1,000 mg by mouth every 6 (six) hours as needed for moderate pain or headache.  Marland Kitchen aspirin EC 81 MG tablet Take 81 mg by mouth at bedtime.   Marland Kitchen atorvastatin (LIPITOR) 40 MG tablet Take 40 mg by mouth daily.  . Ca Carbonate-Mag Hydroxide (ROLAIDS PO) Take 1-2 tablets by mouth daily as needed (heartburn).  . celecoxib (CELEBREX) 200 MG capsule Take 200 mg by mouth daily.   . cyclobenzaprine (FLEXERIL) 5 MG tablet Take 5 mg by mouth at bedtime  as needed for muscle spasms.  . hydrochlorothiazide (HYDRODIURIL) 25 MG tablet Take 25 mg by mouth daily.  Marland Kitchen levothyroxine (SYNTHROID) 137 MCG tablet Take 137 mcg by mouth daily before breakfast.  . metFORMIN (GLUCOPHAGE-XR) 500 MG 24 hr tablet Take 500 mg by mouth daily with breakfast.  . Multiple Vitamins tablet Take 1 tablet by mouth daily.    Review of Systems - General ROS: negative for - chills or fever Respiratory ROS: no cough, shortness of breath, or wheezing Cardiovascular ROS: no chest pain or dyspnea on exertion Gastrointestinal ROS: no abdominal pain, change in bowel habits, or black or bloody stools Genito-Urinary ROS: no dysuria, trouble voiding, or hematuria   BP (!) 156/94   Pulse 73   Temp 97.7 F (36.5 C) (Oral)   Resp 13   Ht 6\' 1"  (1.854 m)   Wt 118.8 kg   SpO2 99%   BMI 34.57 kg/m    Physical Exam General Mental Status-Alert. General Appearance-Not in acute  distress. Build & Nutrition-Well nourished. Posture-Normal posture. Gait-Normal.  Head and Neck Head-normocephalic, atraumatic with no lesions or palpable masses. Trachea-midline.  CV: RRR  Lungs: clear  Abdomen Inspection Inspection of the abdomen reveals - No Hernias. Palpation/Percussion Palpation and Percussion of the abdomen reveal - Soft, Non Tender, No Rigidity (guarding), No hepatosplenomegaly and No Palpable abdominal masses.   Neurologic Neurologic evaluation reveals -alert and oriented x 3 with no impairment of recent or remote memory, normal attention span and ability to concentrate, normal sensation and normal coordination.  Musculoskeletal Normal Exam - Bilateral-Upper Extremity Strength Normal and Lower Extremity Strength Normal.    Assessment & Plan   HISTORY OF RECTAL CANCER (Z85.048) Impression: 58 year old male status post excision of a T1 rectal cancer.   He will undergo a colonoscopy today for follow-up.  Risks include bleeding, pain, perforation, missed pathology and inability to complete the procedure.  I believe he understands this and agrees to proceed.   Current Plans

## 2018-11-06 LAB — SURGICAL PATHOLOGY
